# Patient Record
Sex: Female | Born: 1980
Health system: Southern US, Community
[De-identification: ages and names within clinical notes are randomized; demographics above are authoritative.]

## PROBLEM LIST (undated history)

## (undated) HISTORY — PX: APPENDECTOMY: SHX54

---

## 2004-05-13 ENCOUNTER — Observation Stay (HOSPITAL_COMMUNITY): Admission: EM | Admit: 2004-05-13 | Discharge: 2004-05-14 | Payer: Self-pay | Admitting: Emergency Medicine

## 2004-05-21 ENCOUNTER — Other Ambulatory Visit: Admission: RE | Admit: 2004-05-21 | Discharge: 2004-05-21 | Payer: Self-pay | Admitting: Obstetrics and Gynecology

## 2012-12-12 ENCOUNTER — Other Ambulatory Visit: Payer: Self-pay | Admitting: Obstetrics and Gynecology

## 2014-08-01 ENCOUNTER — Other Ambulatory Visit: Payer: Self-pay | Admitting: Obstetrics and Gynecology

## 2014-08-01 DIAGNOSIS — R928 Other abnormal and inconclusive findings on diagnostic imaging of breast: Secondary | ICD-10-CM

## 2014-08-15 ENCOUNTER — Ambulatory Visit
Admission: RE | Admit: 2014-08-15 | Discharge: 2014-08-15 | Disposition: A | Discharge status: Home / Self Care | Payer: BLUE CROSS/BLUE SHIELD | Source: Ambulatory Visit | Attending: Obstetrics and Gynecology | Admitting: Obstetrics and Gynecology

## 2014-08-15 DIAGNOSIS — R928 Other abnormal and inconclusive findings on diagnostic imaging of breast: Secondary | ICD-10-CM

## 2015-07-30 DIAGNOSIS — H8110 Benign paroxysmal vertigo, unspecified ear: Secondary | ICD-10-CM | POA: Insufficient documentation

## 2018-12-20 ENCOUNTER — Other Ambulatory Visit: Payer: Self-pay

## 2018-12-20 ENCOUNTER — Ambulatory Visit: Payer: Commercial Managed Care - PPO | Admitting: Registered Nurse

## 2018-12-20 ENCOUNTER — Encounter: Payer: Self-pay | Admitting: Registered Nurse

## 2018-12-20 VITALS — BP 113/73 | HR 77 | Temp 98.7°F | Resp 16 | Ht 65.95 in | Wt 162.0 lb

## 2018-12-20 DIAGNOSIS — Z5189 Encounter for other specified aftercare: Secondary | ICD-10-CM | POA: Diagnosis not present

## 2018-12-20 DIAGNOSIS — Z7689 Persons encountering health services in other specified circumstances: Secondary | ICD-10-CM

## 2018-12-20 NOTE — Progress Notes (Signed)
New Patient Office Visit  Subjective:  Patient ID: Tiffany Olson, female    DOB: 1980-12-16  Age: 38 y.o. MRN: [id number]  CC:  Chief Complaint  Patient presents with  . Establish Care  . Abscess    pt states she has an abscess on her back ( Lower) need to have checked at this time. Was seen at [facility] around the 10/23 just need to have it rechecked. They did a culture on the area and her results came back as MRSA so they gave her antibotics     HPI Tiffany Olson presents for visit to establish care and wound check.  Notes that two weeks ago, she noticed an area of warmth and swelling on her lower R back. She went to urgent care, where they drained a sizeable abscess, packed it with gauze, and started abx. The wound culture showed MRSA and she was switched to doxycycline for 10 days. She started that course last night. She presents today to have more consistent follow up, establish a PCP and to have her wound checked.    History reviewed. No pertinent past medical history.  Past Surgical History:  Procedure Laterality Date  . APPENDECTOMY      Family History  Problem Relation Age of Onset  . Ovarian cancer Mother   . Lymphoma Father     Social History   Socioeconomic History  . Marital status: Single    Spouse name: Not on file  . Number of children: Not on file  . Years of education: Not on file  . Highest education level: Not on file  Occupational History  . Not on file  Social Needs  . Financial resource strain: Not hard at all  . Food insecurity    Worry: Never true    Inability: Never true  . Transportation needs    Medical: No    Non-medical: No  Tobacco Use  . Smoking status: Never Smoker  . Smokeless tobacco: Never Used  Substance and Sexual Activity  . Alcohol use: Not on file    Comment: 1-2 per week  . Drug use: Never  . Sexual activity: Not Currently  Lifestyle  . Physical activity    Days per week: 3 days    Minutes per session:  30 min  . Stress: Only a little  Relationships  . Social Herbalist on phone: Three times a week    Gets together: Twice a week    Attends religious service: Patient refused    Active member of club or organization: Patient refused    Attends meetings of clubs or organizations: Patient refused    Relationship status: Not on file  . Intimate partner violence    Fear of current or ex partner: No    Emotionally abused: No    Physically abused: No    Forced sexual activity: No  Other Topics Concern  . Not on file  Social History Narrative  . Not on file    ROS Review of Systems  Constitutional: Negative.   HENT: Negative.   Eyes: Negative.   Respiratory: Negative.   Cardiovascular: Negative.   Gastrointestinal: Negative.   Endocrine: Negative.   Genitourinary: Negative.   Musculoskeletal: Negative.   Skin: Positive for wound (per hpi). Negative for color change, pallor and rash.  Allergic/Immunologic: Negative.   Neurological: Negative.   Hematological: Negative.   Psychiatric/Behavioral: Negative.   All other systems reviewed and are negative.   Objective:  Today's Vitals: BP 113/73   Pulse 77   Temp 98.7 F (37.1 C) (Oral)   Resp 16   Ht 5' 5.95" (1.675 m)   Wt 162 lb (73.5 kg)   LMP 12/03/2018 (Approximate)   SpO2 95%   BMI 26.19 kg/m   Physical Exam Vitals signs and nursing note reviewed.  Constitutional:      General: She is not in acute distress.    Appearance: Normal appearance. She is normal weight. She is not ill-appearing, toxic-appearing or diaphoretic.  Cardiovascular:     Rate and Rhythm: Normal rate and regular rhythm.  Pulmonary:     Effort: Pulmonary effort is normal. No respiratory distress.  Skin:    General: Skin is warm and dry.       Neurological:     General: No focal deficit present.     Mental Status: She is alert and oriented to person, place, and time. Mental status is at baseline.  Psychiatric:        Mood and  Affect: Mood normal.        Behavior: Behavior normal.        Thought Content: Thought content normal.        Judgment: Judgment normal.     Assessment & Plan:   Problem List Items Addressed This Visit    None    Visit Diagnoses    Encounter to establish care    -  Primary   Wound check, abscess          Outpatient Encounter Medications as of 12/20/2018  Medication Sig  . desogestrel-ethinyl estradiol (CYCLESSA) 0.1/0.125/0.15 -0.025 MG tablet Take by mouth.  . doxycycline (MONODOX) 100 MG capsule doxycycline monohydrate 100 mg capsule  Take 1 capsule twice a day by oral route for 14 days.   No facility-administered encounter medications on file as of 12/20/2018.     Follow-up: No follow-ups on file.   PLAN  Reviewed history  Pt to return to clinic in 1 week for wound check  Return at her convenience for CPE and labs  Follows with GYN regularly  Patient encouraged to call clinic with any questions, comments, or concerns.   Tiffany Agee, NP

## 2018-12-20 NOTE — Patient Instructions (Signed)
° ° ° °  If you have lab work done today you will be contacted with your lab results within the next 2 weeks.  If you have not heard from us then please contact us. The fastest way to get your results is to register for My Chart. ° ° °IF you received an x-ray today, you will receive an invoice from [facility]. Please contact [facility] at [phone] with questions or concerns regarding your invoice.  ° °IF you received labwork today, you will receive an invoice from [facility]. Please contact [facility] at [phone] with questions or concerns regarding your invoice.  ° °Our billing staff will not be able to assist you with questions regarding bills from these companies. ° °You will be contacted with the lab results as soon as they are available. The fastest way to get your results is to activate your My Chart account. Instructions are located on the last page of this paperwork. If you have not heard from us regarding the results in 2 weeks, please contact this office. °  ° ° ° °

## 2018-12-22 ENCOUNTER — Telehealth: Payer: Self-pay | Admitting: Registered Nurse

## 2018-12-22 NOTE — Telephone Encounter (Signed)
Copied from [place] [phone]. Topic: General - Other >> Dec 22, 2018 10:19 AM Carolyn Stare wrote: Pt call to say the below med is causing her a reaction and is asking it to be changed   doxycycline (MONODOX) 100 MG capsule

## 2018-12-27 ENCOUNTER — Other Ambulatory Visit: Payer: Self-pay

## 2018-12-27 ENCOUNTER — Ambulatory Visit (INDEPENDENT_AMBULATORY_CARE_PROVIDER_SITE_OTHER): Payer: Commercial Managed Care - PPO | Admitting: Registered Nurse

## 2018-12-27 ENCOUNTER — Encounter: Payer: Self-pay | Admitting: Registered Nurse

## 2018-12-27 VITALS — BP 111/71 | HR 74 | Temp 98.7°F | Resp 16 | Wt 161.0 lb

## 2018-12-27 DIAGNOSIS — Z5189 Encounter for other specified aftercare: Secondary | ICD-10-CM | POA: Diagnosis not present

## 2018-12-27 NOTE — Patient Instructions (Signed)
° ° ° °  If you have lab work done today you will be contacted with your lab results within the next 2 weeks.  If you have not heard from us then please contact us. The fastest way to get your results is to register for My Chart. ° ° °IF you received an x-ray today, you will receive an invoice from [facility]. Please contact [facility] at [phone] with questions or concerns regarding your invoice.  ° °IF you received labwork today, you will receive an invoice from [facility]. Please contact [facility] at [phone] with questions or concerns regarding your invoice.  ° °Our billing staff will not be able to assist you with questions regarding bills from these companies. ° °You will be contacted with the lab results as soon as they are available. The fastest way to get your results is to activate your My Chart account. Instructions are located on the last page of this paperwork. If you have not heard from us regarding the results in 2 weeks, please contact this office. °  ° ° ° °

## 2018-12-27 NOTE — Progress Notes (Signed)
 Established Patient Office Visit  Subjective:  Patient ID: Tiffany Olson, female    DOB: 03/08/1980  Age: 38 y.o. MRN: [id number]  CC:  Chief Complaint  Patient presents with  . Wound Check  . Medication Reaction    pt states she noticed a rash all over her body after starting the mediction (Doxycycline)      HPI Tiffany Olson presents for wound check follow up  As detailed, had abscess I&D from urgent care - presented last week for wound check. It was noted to still have some drainage and was tender and firm. Urgent care had started a wound culture, and they had called to have her start on 100mg doxycycline PO bid for 10 days. She states that last Thursday, she noted a diffuse, itchy, erythematous rash that resolved by Saturday. She states it did not seem like hives, the itching was very minor. Denies swelling of lips, tongue, throat, as well as denying fever, fatigue, shob, chills, headaches, or other symptoms of allergic reaction or sepsis. Feels she is moving in a good direction as the wound is no longer painful and is draining only scant amounts of purulent drainage.  History reviewed. No pertinent past medical history.  Past Surgical History:  Procedure Laterality Date  . APPENDECTOMY      Family History  Problem Relation Age of Onset  . Ovarian cancer Mother   . Lymphoma Father     Social History   Socioeconomic History  . Marital status: Single    Spouse name: Not on file  . Number of children: Not on file  . Years of education: Not on file  . Highest education level: Not on file  Occupational History  . Not on file  Social Needs  . Financial resource strain: Not hard at all  . Food insecurity    Worry: Never true    Inability: Never true  . Transportation needs    Medical: No    Non-medical: No  Tobacco Use  . Smoking status: Never Smoker  . Smokeless tobacco: Never Used  Substance and Sexual Activity  . Alcohol use: Not on file   Comment: 1-2 per week  . Drug use: Never  . Sexual activity: Not Currently  Lifestyle  . Physical activity    Days per week: 3 days    Minutes per session: 30 min  . Stress: Only a little  Relationships  . Social connections    Talks on phone: Three times a week    Gets together: Twice a week    Attends religious service: Patient refused    Active member of club or organization: Patient refused    Attends meetings of clubs or organizations: Patient refused    Relationship status: Not on file  . Intimate partner violence    Fear of current or ex partner: No    Emotionally abused: No    Physically abused: No    Forced sexual activity: No  Other Topics Concern  . Not on file  Social History Narrative  . Not on file    Outpatient Medications Prior to Visit  Medication Sig Dispense Refill  . desogestrel-ethinyl estradiol (CYCLESSA) 0.1/0.125/0.15 -0.025 MG tablet Take by mouth.    . doxycycline (MONODOX) 100 MG capsule doxycycline monohydrate 100 mg capsule  Take 1 capsule twice a day by oral route for 14 days.     No facility-administered medications prior to visit.     Allergies  Allergen Reactions  .   Oxycodone-Acetaminophen Nausea Only    ROS Review of Systems Per hpi   Objective:    Physical Exam  Constitutional: She is oriented to person, place, and time. She appears well-developed and well-nourished. No distress.  Cardiovascular: Normal rate and regular rhythm.  Pulmonary/Chest: Effort normal. No respiratory distress.  Neurological: She is alert and oriented to person, place, and time.  Skin: Skin is warm and dry. No rash noted. She is not diaphoretic. No erythema. No pallor.     Psychiatric: She has a normal mood and affect. Her behavior is normal. Judgment and thought content normal.  Nursing note and vitals reviewed.   BP 111/71   Pulse 74   Temp 98.7 F (37.1 C) (Oral)   Resp 16   Wt 161 lb (73 kg)   LMP 12/03/2018 (Approximate)   SpO2 100%    BMI 26.03 kg/m  Wt Readings from Last 3 Encounters:  12/27/18 161 lb (73 kg)  12/20/18 162 lb (73.5 kg)     Health Maintenance Due  Topic Date Due  . HIV Screening  05/08/1993  . PAP SMEAR-Modifier  05/09/1999    There are no preventive care reminders to display for this patient.  No results found for: TSH No results found for: WBC, HGB, HCT, MCV, PLT No results found for: NA, K, CHLORIDE, CO2, GLUCOSE, BUN, CREATININE, BILITOT, ALKPHOS, AST, ALT, PROT, ALBUMIN, CALCIUM, ANIONGAP, EGFR, GFR No results found for: CHOL No results found for: HDL No results found for: LDLCALC No results found for: TRIG No results found for: CHOLHDL No results found for: HGBA1C    Assessment & Plan:   Problem List Items Addressed This Visit    None    Visit Diagnoses    Wound check, abscess    -  Primary      No orders of the defined types were placed in this encounter.   Follow-up: No follow-ups on file.   PLAN  Her doxycycline rash does not sound allergic in nature - given its relatively minor status and lack of further symptoms after resolution, we will have her continue the doxycycline for the remainder of 10 day course. Reviewed ED precautions and early symptoms of anaphylaxis and SJS.  Wound looks to be healing well. Removed about 1-1.5" of packing today. Encouraged her to remove short portions every 3-4 days as tolerated. Should she have any further concerns regarding the packing, she may return to clinic. We discussed that a slow, inside out healing is desired to try to prevent recurrence. Pt demonstrated understanding  Patient encouraged to call clinic with any questions, comments, or concerns.   Richard Morrow, NP 

## 2019-02-07 ENCOUNTER — Other Ambulatory Visit: Payer: Self-pay | Admitting: Obstetrics and Gynecology

## 2019-02-07 DIAGNOSIS — R928 Other abnormal and inconclusive findings on diagnostic imaging of breast: Secondary | ICD-10-CM

## 2019-02-12 ENCOUNTER — Ambulatory Visit
Admission: RE | Admit: 2019-02-12 | Discharge: 2019-02-12 | Disposition: A | Discharge status: Home / Self Care | Payer: Commercial Managed Care - PPO | Source: Ambulatory Visit | Attending: Obstetrics and Gynecology | Admitting: Obstetrics and Gynecology

## 2019-02-12 ENCOUNTER — Ambulatory Visit
Admission: RE | Admit: 2019-02-12 | Discharge: 2019-02-12 | Disposition: A | Discharge status: Home / Self Care | Payer: BLUE CROSS/BLUE SHIELD | Source: Ambulatory Visit | Attending: Obstetrics and Gynecology | Admitting: Obstetrics and Gynecology

## 2019-02-12 ENCOUNTER — Other Ambulatory Visit: Payer: Self-pay | Admitting: Obstetrics and Gynecology

## 2019-02-12 ENCOUNTER — Other Ambulatory Visit: Payer: Self-pay

## 2019-02-12 DIAGNOSIS — N6310 Unspecified lump in the right breast, unspecified quadrant: Secondary | ICD-10-CM

## 2019-02-12 DIAGNOSIS — R928 Other abnormal and inconclusive findings on diagnostic imaging of breast: Secondary | ICD-10-CM

## 2019-02-21 ENCOUNTER — Ambulatory Visit
Admission: RE | Admit: 2019-02-21 | Discharge: 2019-02-21 | Disposition: A | Discharge status: Home / Self Care | Payer: 59 | Source: Ambulatory Visit | Attending: Obstetrics and Gynecology | Admitting: Obstetrics and Gynecology

## 2019-02-21 ENCOUNTER — Other Ambulatory Visit: Payer: Self-pay

## 2019-02-21 DIAGNOSIS — N6310 Unspecified lump in the right breast, unspecified quadrant: Secondary | ICD-10-CM

## 2019-11-12 ENCOUNTER — Ambulatory Visit (INDEPENDENT_AMBULATORY_CARE_PROVIDER_SITE_OTHER): Payer: 59 | Admitting: Registered Nurse

## 2019-11-12 ENCOUNTER — Encounter: Payer: Self-pay | Admitting: Registered Nurse

## 2019-11-12 ENCOUNTER — Other Ambulatory Visit: Payer: Self-pay

## 2019-11-12 VITALS — BP 120/74 | HR 95 | Temp 97.8°F | Resp 18 | Ht 65.0 in | Wt 160.8 lb

## 2019-11-12 DIAGNOSIS — N644 Mastodynia: Secondary | ICD-10-CM | POA: Diagnosis not present

## 2019-11-12 DIAGNOSIS — N61 Mastitis without abscess: Secondary | ICD-10-CM | POA: Diagnosis not present

## 2019-11-12 MED ORDER — DICLOXACILLIN SODIUM 500 MG PO CAPS: 500.0000 mg | ORAL_CAPSULE | Freq: Four times a day (QID) | ORAL | 0 refills | Status: DC

## 2019-11-12 MED ORDER — TRAMADOL HCL 50 MG PO TABS: 50.0000 mg | ORAL_TABLET | Freq: Three times a day (TID) | ORAL | 0 refills | Status: AC | PRN

## 2019-11-12 MED ORDER — DICLOFENAC SODIUM 75 MG PO TBEC: 75.0000 mg | DELAYED_RELEASE_TABLET | Freq: Two times a day (BID) | ORAL | 0 refills | Status: DC

## 2019-11-12 NOTE — Progress Notes (Signed)
Acute Office Visit  Subjective:    Patient ID: Tiffany Olson, female    DOB: 05/19/1980, 39 y.o.   MRN: [id number]  Chief Complaint  Patient presents with  . Cyst    patient states she has noticed an bump on her on her breast and really red and painful thats bigger than a quarter.    HPI Patient is in today for L breast pain  Noted around 10 days ago there was a small bump Acutely got bigger, warmer, and very tender No drainage or clear "head" to lesion fam hx of cancer - has been screened recently and had biopsy in Jan which was benign No axillary pain  No nipple discharge No new veins  No other concerns at this time  No past medical history on file.  Past Surgical History:  Procedure Laterality Date  . APPENDECTOMY      Family History  Problem Relation Age of Onset  . Ovarian cancer Mother   . Lymphoma Father     Social History   Socioeconomic History  . Marital status: Single    Spouse name: Not on file  . Number of children: Not on file  . Years of education: Not on file  . Highest education level: Not on file  Occupational History  . Not on file  Tobacco Use  . Smoking status: Never Smoker  . Smokeless tobacco: Never Used  Vaping Use  . Vaping Use: Never used  Substance and Sexual Activity  . Alcohol use: Not on file    Comment: 1-2 per week  . Drug use: Never  . Sexual activity: Not Currently  Other Topics Concern  . Not on file  Social History Narrative  . Not on file   Social Determinants of Health   Financial Resource Strain: Low Risk   . Difficulty of Paying Living Expenses: Not hard at all  Food Insecurity: No Food Insecurity  . Worried About Charity fundraiser in the Last Year: Never true  . Ran Out of Food in the Last Year: Never true  Transportation Needs: No Transportation Needs  . Lack of Transportation (Medical): No  . Lack of Transportation (Non-Medical): No  Physical Activity: Insufficiently Active  . Days of  Exercise per Week: 3 days  . Minutes of Exercise per Session: 30 min  Stress: No Stress Concern Present  . Feeling of Stress : Only a little  Social Connections: Unknown  . Frequency of Communication with Friends and Family: Three times a week  . Frequency of Social Gatherings with Friends and Family: Twice a week  . Attends Religious Services: Patient refused  . Active Member of Clubs or Organizations: Patient refused  . Attends Archivist Meetings: Patient refused  . Marital Status: Not on file  Intimate Partner Violence: Not At Risk  . Fear of Current or Ex-Partner: No  . Emotionally Abused: No  . Physically Abused: No  . Sexually Abused: No    Outpatient Medications Prior to Visit  Medication Sig Dispense Refill  . desogestrel-ethinyl estradiol (CYCLESSA) 0.1/0.125/0.15 -0.025 MG tablet Take by mouth.    . doxycycline (MONODOX) 100 MG capsule doxycycline monohydrate 100 mg capsule  Take 1 capsule twice a day by oral route for 14 days. (Patient not taking: Reported on 11/12/2019)     No facility-administered medications prior to visit.    Allergies  Allergen Reactions  . Oxycodone-Acetaminophen Nausea Only    Review of Systems Per hpi  Objective:    Physical Exam Vitals and nursing note reviewed.  Constitutional:      General: She is not in acute distress.    Appearance: Normal appearance. She is normal weight. She is not ill-appearing, toxic-appearing or diaphoretic.  Cardiovascular:     Rate and Rhythm: Normal rate and regular rhythm.  Pulmonary:     Effort: Pulmonary effort is normal. No respiratory distress.  Chest:    Skin:    General: Skin is warm and dry.     Coloration: Skin is not jaundiced or pale.     Findings: Erythema present. No bruising, lesion or rash.  Neurological:     Mental Status: She is alert.     BP 120/74   Pulse 95   Temp 97.8 F (36.6 C) (Temporal)   Resp 18   Ht 5' 5"  (1.651 m)   Wt 160 lb 12.8 oz (72.9 kg)    SpO2 98%   BMI 26.76 kg/m  Wt Readings from Last 3 Encounters:  11/12/19 160 lb 12.8 oz (72.9 kg)  12/27/18 161 lb (73 kg)  12/20/18 162 lb (73.5 kg)    There are no preventive care reminders to display for this patient.  There are no preventive care reminders to display for this patient.   No results found for: TSH No results found for: WBC, HGB, HCT, MCV, PLT No results found for: NA, K, CHLORIDE, CO2, GLUCOSE, BUN, CREATININE, BILITOT, ALKPHOS, AST, ALT, PROT, ALBUMIN, CALCIUM, ANIONGAP, EGFR, GFR No results found for: CHOL No results found for: HDL No results found for: LDLCALC No results found for: TRIG No results found for: CHOLHDL No results found for: HGBA1C     Assessment & Plan:   Problem List Items Addressed This Visit    None    Visit Diagnoses    Infection of breast    -  Primary   Relevant Medications   dicloxacillin (DYNAPEN) 500 MG capsule   Breast pain, right       Relevant Medications   diclofenac (VOLTAREN) 75 MG EC tablet   traMADol (ULTRAM) 50 MG tablet       Meds ordered this encounter  Medications  . dicloxacillin (DYNAPEN) 500 MG capsule    Sig: Take 1 capsule (500 mg total) by mouth 4 (four) times daily.    Dispense:  20 capsule    Refill:  0    Order Specific Question:   Supervising Provider    Answer:   Carlota Raspberry, JEFFREY R [[id number]]  . diclofenac (VOLTAREN) 75 MG EC tablet    Sig: Take 1 tablet (75 mg total) by mouth 2 (two) times daily.    Dispense:  14 tablet    Refill:  0    Order Specific Question:   Supervising Provider    Answer:   Carlota Raspberry, JEFFREY R [[id number]]  . traMADol (ULTRAM) 50 MG tablet    Sig: Take 1 tablet (50 mg total) by mouth every 8 (eight) hours as needed for up to 5 days.    Dispense:  7 tablet    Refill:  0    Order Specific Question:   Supervising Provider    Answer:   Carlota Raspberry, JEFFREY R [[id number]]   PLAN  Appears as infection rather than acute malignancy. Dynapen 526m PO qid for 5 days  Diclofenac and tramadol  for pain - supplement with tylenol as needed  Rest, hydration, heat/ice.  If no improvement within 3-4 days will get mammography to rule out malignancy  Patient encouraged to call clinic with any questions, comments, or concerns.  Maximiano Coss, NP

## 2019-11-12 NOTE — Patient Instructions (Signed)
° ° ° °  If you have lab work done today you will be contacted with your lab results within the next 2 weeks.  If you have not heard from us then please contact us. The fastest way to get your results is to register for My Chart. ° ° °IF you received an x-ray today, you will receive an invoice from [facility]. Please contact [facility] at [phone] with questions or concerns regarding your invoice.  ° °IF you received labwork today, you will receive an invoice from [facility]. Please contact [facility] at [phone] with questions or concerns regarding your invoice.  ° °Our billing staff will not be able to assist you with questions regarding bills from these companies. ° °You will be contacted with the lab results as soon as they are available. The fastest way to get your results is to activate your My Chart account. Instructions are located on the last page of this paperwork. If you have not heard from us regarding the results in 2 weeks, please contact this office. °  ° ° ° °

## 2019-11-20 ENCOUNTER — Other Ambulatory Visit: Payer: Self-pay | Admitting: Registered Nurse

## 2019-11-20 DIAGNOSIS — N61 Mastitis without abscess: Secondary | ICD-10-CM

## 2019-11-20 NOTE — Telephone Encounter (Signed)
Requested medication (s) are due for refill today: no  Requested medication (s) are on the active medication list: yes   Last refill:  11/12/19 #20 0 refills   Future visit scheduled: yes in 2 months   Notes to clinic:  no new refills ordered. Patient requesting another refill, do you want to renew?     Requested Prescriptions  Pending Prescriptions Disp Refills   dicloxacillin (DYNAPEN) 500 MG capsule 20 capsule 0    Sig: Take 1 capsule (500 mg total) by mouth 4 (four) times daily.      Off-Protocol Failed - 11/20/2019  5:34 PM      Failed - Medication not assigned to a protocol, review manually.      Passed - Valid encounter within last 12 months    Recent Outpatient Visits           1 week ago Infection of breast   [facility], Gerlene Burdock, NP   10 months ago Wound check, abscess   [facility], Gerlene Burdock, NP   11 months ago Encounter to establish care   [facility], Gerlene Burdock, NP       Future Appointments             In 2 months Janeece Agee, NP [facility] at [place], [facility]

## 2019-11-20 NOTE — Telephone Encounter (Signed)
dicloxacillin (DYNAPEN) 500 MG capsule Medication Date: 11/12/2019 Department: [facility] at [place] Ordering/Authorizing: Janeece Agee, NP   This pt wants a refill as this has been working very well but infection is almost gone, pain almost gone but not quite all the way. Wants refill as feels def working. [id number] [place], [place] - [address] AT [facility] [address] Phone:  [phone]  Fax:  [phone]    New  Pharmacy

## 2019-11-21 MED ORDER — DICLOXACILLIN SODIUM 500 MG PO CAPS: 500.0000 mg | ORAL_CAPSULE | Freq: Four times a day (QID) | ORAL | 0 refills | Status: DC

## 2019-11-21 NOTE — Telephone Encounter (Signed)
Pt is returning the call from message below pt would like a call back when available. Please advise.

## 2019-11-21 NOTE — Telephone Encounter (Signed)
LVM for patient to return call regarding dynapen rx request to get more information regarding infection. Refer to 09/27 OV note.

## 2019-11-21 NOTE — Telephone Encounter (Signed)
Patient is requesting a refill for antibiotics due to area still being red and experiencing some itching , regular lotion not really helping . No longer having pain , swelling has gone down . Please advise

## 2020-01-22 ENCOUNTER — Ambulatory Visit (INDEPENDENT_AMBULATORY_CARE_PROVIDER_SITE_OTHER): Payer: 59 | Admitting: Registered Nurse

## 2020-01-22 ENCOUNTER — Other Ambulatory Visit: Payer: Self-pay

## 2020-01-22 ENCOUNTER — Encounter: Payer: Self-pay | Admitting: Registered Nurse

## 2020-01-22 VITALS — BP 116/75 | HR 77 | Temp 98.0°F | Ht 65.0 in | Wt 163.0 lb

## 2020-01-22 DIAGNOSIS — M25562 Pain in left knee: Secondary | ICD-10-CM

## 2020-01-22 DIAGNOSIS — Z1329 Encounter for screening for other suspected endocrine disorder: Secondary | ICD-10-CM

## 2020-01-22 DIAGNOSIS — Z0000 Encounter for general adult medical examination without abnormal findings: Secondary | ICD-10-CM

## 2020-01-22 DIAGNOSIS — Z0001 Encounter for general adult medical examination with abnormal findings: Secondary | ICD-10-CM

## 2020-01-22 DIAGNOSIS — G8929 Other chronic pain: Secondary | ICD-10-CM

## 2020-01-22 DIAGNOSIS — Z130 Encounter for screening for diseases of the blood and blood-forming organs and certain disorders involving the immune mechanism: Secondary | ICD-10-CM

## 2020-01-22 DIAGNOSIS — Z13228 Encounter for screening for other metabolic disorders: Secondary | ICD-10-CM

## 2020-01-22 DIAGNOSIS — L570 Actinic keratosis: Secondary | ICD-10-CM

## 2020-01-22 DIAGNOSIS — Z13220 Encounter for screening for lipoid disorders: Secondary | ICD-10-CM

## 2020-01-22 LAB — COMPREHENSIVE METABOLIC PANEL
ALT: 13 IU/L (ref 0–32)
Alkaline Phosphatase: 72 IU/L (ref 44–121)

## 2020-01-22 LAB — CBC WITH DIFFERENTIAL
EOS (ABSOLUTE): 0.2 10*3/uL (ref 0.0–0.4)
Eos: 3 %
Immature Granulocytes: 0 %
MCHC: 34.1 g/dL (ref 31.5–35.7)
Monocytes Absolute: 0.5 10*3/uL (ref 0.1–0.9)
RDW: 11.9 % (ref 11.7–15.4)

## 2020-01-22 LAB — HEMOGLOBIN A1C: Hgb A1c MFr Bld: 4.9 % (ref 4.8–5.6)

## 2020-01-22 NOTE — Progress Notes (Signed)
Established Patient Office Visit  Subjective:  Patient ID: Tiffany Olson, female    DOB: 1980/07/28  Age: 39 y.o. MRN: [id number]  CC:  Chief Complaint  Patient presents with  . Annual Exam    yearly physical / no pap sees gyn Dr Lowe/ mammo 07/2019    HPI Tiffany Olson presents for CPE and labs  Has followed with women's health for pap and mammogram No hx abnormal mammogram Hx of abnormal pap - every few years - follow up has always been normal Last pap and mammo in June 2021 - both wnl  Does not get routine bloodwork through gyn, interested in getting this today.  Skin lesion: small, firm raised lesions on forehead and cheek L side of face. Have been stable since she noticed 1+ year ago. No ABCDE changes that warrant concern. No new lesions. Has had hx of much sun exposure.  Knee soreness: L knee, usually aches after 5+ miles of hiking. Overall manageable. Taking glucosamine-chondroitin otc with good effect. Does not feel she needs otc analgesics at this time. Wants to delay further intervention as long as possible.  Otherwise feeling well. Histories updated as warranted.  No past medical history on file.  Past Surgical History:  Procedure Laterality Date  . APPENDECTOMY      Family History  Problem Relation Age of Onset  . Ovarian cancer Mother   . Lymphoma Father     Social History   Socioeconomic History  . Marital status: Single    Spouse name: Not on file  . Number of children: Not on file  . Years of education: Not on file  . Highest education level: Not on file  Occupational History  . Not on file  Tobacco Use  . Smoking status: Never Smoker  . Smokeless tobacco: Never Used  Vaping Use  . Vaping Use: Never used  Substance and Sexual Activity  . Alcohol use: Not on file    Comment: 1-2 per week  . Drug use: Never  . Sexual activity: Not Currently  Other Topics Concern  . Not on file  Social History Narrative  . Not on file    Social Determinants of Health   Financial Resource Strain:   . Difficulty of Paying Living Expenses: Not on file  Food Insecurity:   . Worried About Charity fundraiser in the Last Year: Not on file  . Ran Out of Food in the Last Year: Not on file  Transportation Needs:   . Lack of Transportation (Medical): Not on file  . Lack of Transportation (Non-Medical): Not on file  Physical Activity:   . Days of Exercise per Week: Not on file  . Minutes of Exercise per Session: Not on file  Stress:   . Feeling of Stress : Not on file  Social Connections:   . Frequency of Communication with Friends and Family: Not on file  . Frequency of Social Gatherings with Friends and Family: Not on file  . Attends Religious Services: Not on file  . Active Member of Clubs or Organizations: Not on file  . Attends Archivist Meetings: Not on file  . Marital Status: Not on file  Intimate Partner Violence:   . Fear of Current or Ex-Partner: Not on file  . Emotionally Abused: Not on file  . Physically Abused: Not on file  . Sexually Abused: Not on file    Outpatient Medications Prior to Visit  Medication Sig Dispense Refill  .  desogestrel-ethinyl estradiol (CYCLESSA) 0.1/0.125/0.15 -0.025 MG tablet Take by mouth.    . diclofenac (VOLTAREN) 75 MG EC tablet Take 1 tablet (75 mg total) by mouth 2 (two) times daily. 14 tablet 0  . dicloxacillin (DYNAPEN) 500 MG capsule Take 1 capsule (500 mg total) by mouth 4 (four) times daily. 20 capsule 0  . doxycycline (MONODOX) 100 MG capsule doxycycline monohydrate 100 mg capsule  Take 1 capsule twice a day by oral route for 14 days. (Patient not taking: Reported on 11/12/2019)     No facility-administered medications prior to visit.    Allergies  Allergen Reactions  . Oxycodone-Acetaminophen Nausea Only    ROS Review of Systems  Constitutional: Negative.   HENT: Negative.   Eyes: Negative.   Respiratory: Negative.   Cardiovascular: Negative.    Gastrointestinal: Negative.   Genitourinary: Negative.   Musculoskeletal: Negative.   Skin: Negative.   Neurological: Negative.   Psychiatric/Behavioral: Negative.       Objective:    Physical Exam Vitals and nursing note reviewed.  Constitutional:      General: She is not in acute distress.    Appearance: Normal appearance. She is normal weight. She is not ill-appearing, toxic-appearing or diaphoretic.  HENT:     Head: Normocephalic and atraumatic.     Right Ear: Tympanic membrane, ear canal and external ear normal. There is no impacted cerumen.     Left Ear: Tympanic membrane, ear canal and external ear normal. There is no impacted cerumen.     Nose: Nose normal. No congestion or rhinorrhea.     Mouth/Throat:     Mouth: Mucous membranes are moist.     Pharynx: Oropharynx is clear. No oropharyngeal exudate or posterior oropharyngeal erythema.  Eyes:     General: No scleral icterus.       Right eye: No discharge.        Left eye: No discharge.     Extraocular Movements: Extraocular movements intact.     Conjunctiva/sclera: Conjunctivae normal.     Pupils: Pupils are equal, round, and reactive to light.  Cardiovascular:     Rate and Rhythm: Normal rate and regular rhythm.     Pulses: Normal pulses.     Heart sounds: Normal heart sounds. No murmur heard.  No friction rub. No gallop.   Pulmonary:     Effort: Pulmonary effort is normal. No respiratory distress.     Breath sounds: Normal breath sounds. No stridor. No wheezing, rhonchi or rales.  Chest:     Chest wall: No tenderness.  Abdominal:     General: Abdomen is flat. Bowel sounds are normal. There is no distension.     Palpations: Abdomen is soft. There is no mass.     Tenderness: There is no abdominal tenderness. There is no right CVA tenderness, left CVA tenderness, guarding or rebound.     Hernia: No hernia is present.  Musculoskeletal:        General: No swelling, tenderness, deformity or signs of injury. Normal  range of motion.     Right lower leg: No edema.     Left lower leg: No edema.  Skin:    General: Skin is warm and dry.     Capillary Refill: Capillary refill takes less than 2 seconds.     Coloration: Skin is not jaundiced or pale.     Findings: Lesion (likely keratotic skin lesions on L side of face.) present. No bruising, erythema or rash.  Neurological:  General: No focal deficit present.     Mental Status: She is alert and oriented to person, place, and time. Mental status is at baseline.     Cranial Nerves: No cranial nerve deficit.     Sensory: No sensory deficit.     Motor: No weakness.     Coordination: Coordination normal.     Gait: Gait normal.     Deep Tendon Reflexes: Reflexes normal.  Psychiatric:        Mood and Affect: Mood normal.        Behavior: Behavior normal.        Thought Content: Thought content normal.        Judgment: Judgment normal.     BP 116/75   Pulse 77   Temp 98 F (36.7 C)   Ht 5' 5" (1.651 m)   Wt 163 lb (73.9 kg)   LMP 01/14/2020   SpO2 98%   BMI 27.12 kg/m  Wt Readings from Last 3 Encounters:  01/22/20 163 lb (73.9 kg)  11/12/19 160 lb 12.8 oz (72.9 kg)  12/27/18 161 lb (73 kg)     There are no preventive care reminders to display for this patient.  There are no preventive care reminders to display for this patient.  No results found for: TSH No results found for: WBC, HGB, HCT, MCV, PLT No results found for: NA, K, CHLORIDE, CO2, GLUCOSE, BUN, CREATININE, BILITOT, ALKPHOS, AST, ALT, PROT, ALBUMIN, CALCIUM, ANIONGAP, EGFR, GFR No results found for: CHOL No results found for: HDL No results found for: LDLCALC No results found for: TRIG No results found for: CHOLHDL No results found for: HGBA1C    Assessment & Plan:   Problem List Items Addressed This Visit      Other   Chronic pain of left knee   Keratotic lesion   Relevant Orders   Ambulatory referral to Dermatology    Other Visit Diagnoses    Routine general  medical examination at a health care facility    -  Primary   Screening for endocrine, metabolic and immunity disorder       Relevant Orders   CBC With Differential   Comprehensive metabolic panel   Hemoglobin A1c   TSH   Lipid screening       Relevant Orders   Lipid panel      No orders of the defined types were placed in this encounter.   Follow-up: Return in 1 year (on 01/21/2021).   PLAN  Continue to monitor knee. PT viable next step. Ok to use otc diclofenac gel for pain relief if needed  While skin lesions appear benign, will refer to derm given fair complexion and hx of sun exposure. Would benefit from full skin survey  Labs collected. Will follow up with the patient as warranted.  Exam unremarkable  Return in 1 year for CPE and labs  Patient encouraged to call clinic with any questions, comments, or concerns.  Maximiano Coss, NP

## 2020-01-22 NOTE — Patient Instructions (Addendum)
   If you have lab work done today you will be contacted with your lab results within the next 2 weeks.  If you have not heard from us then please contact us. The fastest way to get your results is to register for My Chart.   IF you received an x-ray today, you will receive an invoice from [facility]. Please contact [facility] at [phone] with questions or concerns regarding your invoice.   IF you received labwork today, you will receive an invoice from [facility]. Please contact [facility] at [phone] with questions or concerns regarding your invoice.   Our billing staff will not be able to assist you with questions regarding bills from these companies.  You will be contacted with the lab results as soon as they are available. The fastest way to get your results is to activate your My Chart account. Instructions are located on the last page of this paperwork. If you have not heard from us regarding the results in 2 weeks, please contact this office.       Health Maintenance, Female Adopting a healthy lifestyle and getting preventive care are important in promoting health and wellness. Ask your health care provider about:  The right schedule for you to have regular tests and exams.  Things you can do on your own to prevent diseases and keep yourself healthy. What should I know about diet, weight, and exercise? Eat a healthy diet   Eat a diet that includes plenty of vegetables, fruits, low-fat dairy products, and lean protein.  Do not eat a lot of foods that are high in solid fats, added sugars, or sodium. Maintain a healthy weight Body mass index (BMI) is used to identify weight problems. It estimates body fat based on height and weight. Your health care provider can help determine your BMI and help you achieve or maintain a healthy weight. Get regular exercise Get regular exercise. This is one of the most important things you can do for your health. Most  adults should:  Exercise for at least 150 minutes each week. The exercise should increase your heart rate and make you sweat (moderate-intensity exercise).  Do strengthening exercises at least twice a week. This is in addition to the moderate-intensity exercise.  Spend less time sitting. Even light physical activity can be beneficial. Watch cholesterol and blood lipids Have your blood tested for lipids and cholesterol at 41 years of age, then have this test every 5 years. Have your cholesterol levels checked more often if:  Your lipid or cholesterol levels are high.  You are older than 40 years of age.  You are at high risk for heart disease. What should I know about cancer screening? Depending on your health history and family history, you may need to have cancer screening at various ages. This may include screening for:  Breast cancer.  Cervical cancer.  Colorectal cancer.  Skin cancer.  Lung cancer. What should I know about heart disease, diabetes, and high blood pressure? Blood pressure and heart disease  High blood pressure causes heart disease and increases the risk of stroke. This is more likely to develop in people who have high blood pressure readings, are of African descent, or are overweight.  Have your blood pressure checked: ? Every 3-5 years if you are 18-39 years of age. ? Every year if you are 40 years old or older. Diabetes Have regular diabetes screenings. This checks your fasting blood sugar level. Have the screening done:  Once   every three years after age 40 if you are at a normal weight and have a low risk for diabetes.  More often and at a younger age if you are overweight or have a high risk for diabetes. What should I know about preventing infection? Hepatitis B If you have a higher risk for hepatitis B, you should be screened for this virus. Talk with your health care provider to find out if you are at risk for hepatitis B infection. Hepatitis  C Testing is recommended for:  Everyone born from 1945 through 1965.  Anyone with known risk factors for hepatitis C. Sexually transmitted infections (STIs)  Get screened for STIs, including gonorrhea and chlamydia, if: ? You are sexually active and are younger than 41 years of age. ? You are older than 41 years of age and your health care provider tells you that you are at risk for this type of infection. ? Your sexual activity has changed since you were last screened, and you are at increased risk for chlamydia or gonorrhea. Ask your health care provider if you are at risk.  Ask your health care provider about whether you are at high risk for HIV. Your health care provider may recommend a prescription medicine to help prevent HIV infection. If you choose to take medicine to prevent HIV, you should first get tested for HIV. You should then be tested every 3 months for as long as you are taking the medicine. Pregnancy  If you are about to stop having your period (premenopausal) and you may become pregnant, seek counseling before you get pregnant.  Take 400 to 800 micrograms (mcg) of folic acid every day if you become pregnant.  Ask for birth control (contraception) if you want to prevent pregnancy. Osteoporosis and menopause Osteoporosis is a disease in which the bones lose minerals and strength with aging. This can result in bone fractures. If you are 65 years old or older, or if you are at risk for osteoporosis and fractures, ask your health care provider if you should:  Be screened for bone loss.  Take a calcium or vitamin D supplement to lower your risk of fractures.  Be given hormone replacement therapy (HRT) to treat symptoms of menopause. Follow these instructions at home: Lifestyle  Do not use any products that contain nicotine or tobacco, such as cigarettes, e-cigarettes, and chewing tobacco. If you need help quitting, ask your health care provider.  Do not use street  drugs.  Do not share needles.  Ask your health care provider for help if you need support or information about quitting drugs. Alcohol use  Do not drink alcohol if: ? Your health care provider tells you not to drink. ? You are pregnant, may be pregnant, or are planning to become pregnant.  If you drink alcohol: ? Limit how much you use to 0-1 drink a day. ? Limit intake if you are breastfeeding.  Be aware of how much alcohol is in your drink. In the [place] one drink equals one 12 oz bottle of beer (355 mL), one 5 oz glass of wine (148 mL), or one 1 oz glass of hard liquor (44 mL). General instructions  Schedule regular health, dental, and eye exams.  Stay current with your vaccines.  Tell your health care provider if: ? You often feel depressed. ? You have ever been abused or do not feel safe at home. Summary  Adopting a healthy lifestyle and getting preventive care are important in promoting health and   wellness.  Follow your health care provider's instructions about healthy diet, exercising, and getting tested or screened for diseases.  Follow your health care provider's instructions on monitoring your cholesterol and blood pressure. This information is not intended to replace advice given to you by your health care provider. Make sure you discuss any questions you have with your health care provider. Document Revised: 01/25/2018 Document Reviewed: 01/25/2018 Elsevier Patient Education  2020 [employer]vier Inc.     Why follow it? Research shows. . Those who follow the Mediterranean diet have a reduced risk of heart disease  . The diet is associated with a reduced incidence of Parkinson's and Alzheimer's diseases . People following the diet may have longer life expectancies and lower rates of chronic diseases  . The Dietary Guidelines for Americans recommends the Mediterranean diet as an eating plan to promote health and prevent disease  What Is the Mediterranean Diet?   . Healthy eating plan based on typical foods and recipes of Mediterranean-style cooking . The diet is primarily a plant based diet; these foods should make up a majority of meals   Starches - Plant based foods should make up a majority of meals - They are an important sources of vitamins, minerals, energy, antioxidants, and fiber - Choose whole grains, foods high in fiber and minimally processed items  - Typical grain sources include wheat, oats, barley, corn, brown rice, bulgar, farro, millet, polenta, couscous  - Various types of beans include chickpeas, lentils, fava beans, black beans, white beans   Fruits  Veggies - Large quantities of antioxidant rich fruits & veggies; 6 or more servings  - Vegetables can be eaten raw or lightly drizzled with oil and cooked  - Vegetables common to the traditional Mediterranean Diet include: artichokes, arugula, beets, broccoli, brussel sprouts, cabbage, carrots, celery, collard greens, cucumbers, eggplant, kale, leeks, lemons, lettuce, mushrooms, okra, onions, peas, peppers, potatoes, pumpkin, radishes, rutabaga, shallots, spinach, sweet potatoes, turnips, zucchini - Fruits common to the Mediterranean Diet include: apples, apricots, avocados, cherries, clementines, dates, figs, grapefruits, grapes, melons, nectarines, oranges, peaches, pears, pomegranates, strawberries, tangerines  Fats - Replace butter and margarine with healthy oils, such as olive oil, canola oil, and tahini  - Limit nuts to no more than a handful a day  - Nuts include walnuts, almonds, pecans, pistachios, pine nuts  - Limit or avoid candied, honey roasted or heavily salted nuts - Olives are central to the Mediterranean diet - can be eaten whole or used in a variety of dishes   Meats Protein - Limiting red meat: no more than a few times a month - When eating red meat: choose lean cuts and keep the portion to the size of deck of cards - Eggs: approx. 0 to 4 times a week  - Fish and lean  poultry: at least 2 a week  - Healthy protein sources include, chicken, turkey, lean beef, lamb - Increase intake of seafood such as tuna, salmon, trout, mackerel, shrimp, scallops - Avoid or limit high fat processed meats such as sausage and bacon  Dairy - Include moderate amounts of low fat dairy products  - Focus on healthy dairy such as fat free yogurt, skim milk, low or reduced fat cheese - Limit dairy products higher in fat such as whole or 2% milk, cheese, ice cream  Alcohol - Moderate amounts of red wine is ok  - No more than 5 oz daily for women (all ages) and men older than age 65  - No   more than 10 oz of wine daily for men younger than 65  Other - Limit sweets and other desserts  - Use herbs and spices instead of salt to flavor foods  - Herbs and spices common to the traditional Mediterranean Diet include: basil, bay leaves, chives, cloves, cumin, fennel, garlic, lavender, marjoram, mint, oregano, parsley, pepper, rosemary, sage, savory, sumac, tarragon, thyme   It's not just a diet, it's a lifestyle:  . The Mediterranean diet includes lifestyle factors typical of those in the region  . Foods, drinks and meals are best eaten with others and savored . Daily physical activity is important for overall good health . This could be strenuous exercise like running and aerobics . This could also be more leisurely activities such as walking, housework, yard-work, or taking the stairs . Moderation is the key; a balanced and healthy diet accommodates most foods and drinks . Consider portion sizes and frequency of consumption of certain foods   Meal Ideas & Options:  . Breakfast:  o Whole wheat toast or whole wheat English muffins with peanut butter & hard boiled egg o Steel cut oats topped with apples & cinnamon and skim milk  o Fresh fruit: banana, strawberries, melon, berries, peaches  o Smoothies: strawberries, bananas, greek yogurt, peanut butter o Low fat greek yogurt with  blueberries and granola  o Egg white omelet with spinach and mushrooms o Breakfast couscous: whole wheat couscous, apricots, skim milk, cranberries  . Sandwiches:  o Hummus and grilled vegetables (peppers, zucchini, squash) on whole wheat bread   o Grilled chicken on whole wheat pita with lettuce, tomatoes, cucumbers or tzatziki  o Tuna salad on whole wheat bread: tuna salad made with greek yogurt, olives, red peppers, capers, green onions o Garlic rosemary lamb pita: lamb sauted with garlic, rosemary, salt & pepper; add lettuce, cucumber, greek yogurt to pita - flavor with lemon juice and black pepper  . Seafood:  o Mediterranean grilled salmon, seasoned with garlic, basil, parsley, lemon juice and black pepper o Shrimp, lemon, and spinach whole-grain pasta salad made with low fat greek yogurt  o Seared scallops with lemon orzo  o Seared tuna steaks seasoned salt, pepper, coriander topped with tomato mixture of olives, tomatoes, olive oil, minced garlic, parsley, green onions and cappers  . Meats:  o Herbed greek chicken salad with kalamata olives, cucumber, feta  o Red bell peppers stuffed with spinach, bulgur, lean ground beef (or lentils) & topped with feta   o Kebabs: skewers of chicken, tomatoes, onions, zucchini, squash  o Turkey burgers: made with red onions, mint, dill, lemon juice, feta cheese topped with roasted red peppers . Vegetarian o Cucumber salad: cucumbers, artichoke hearts, celery, red onion, feta cheese, tossed in olive oil & lemon juice  o Hummus and whole grain pita points with a greek salad (lettuce, tomato, feta, olives, cucumbers, red onion) o Lentil soup with celery, carrots made with vegetable broth, garlic, salt and pepper  o Tabouli salad: parsley, bulgur, mint, scallions, cucumbers, tomato, radishes, lemon juice, olive oil, salt and pepper.       Fat and Cholesterol Restricted Eating Plan Eating a diet that limits fat and cholesterol may help lower your risk  for heart disease and other conditions. Your body needs fat and cholesterol for basic functions, but eating too much of these things can be harmful to your health. Your health care provider may order lab tests to check your blood fat (lipid) and cholesterol levels. This helps your health   care provider understand your risk for certain conditions and whether you need to make diet changes. Work with your health care provider or dietitian to make an eating plan that is right for you. Your plan includes:  Limit your fat intake to ______% or less of your total calories a day.  Limit your saturated fat intake to ______% or less of your total calories a day.  Limit the amount of cholesterol in your diet to less than _________mg a day.  Eat ___________ g of fiber a day. What are tips for following this plan? General guidelines   If you are overweight, work with your health care provider to lose weight safely. Losing just 5-10% of your body weight can improve your overall health and help prevent diseases such as diabetes and heart disease.  Avoid: ? Foods with added sugar. ? Fried foods. ? Foods that contain partially hydrogenated oils, including stick margarine, some tub margarines, cookies, crackers, and other baked goods.  Limit alcohol intake to no more than 1 drink a day for nonpregnant women and 2 drinks a day for men. One drink equals 12 oz of beer, 5 oz of wine, or 1 oz of hard liquor. Reading food labels  Check food labels for: ? Trans fats, partially hydrogenated oils, or high amounts of saturated fat. Avoid foods that contain saturated fat and trans fat. ? The amount of cholesterol in each serving. Try to eat no more than 200 mg of cholesterol each day. ? The amount of fiber in each serving. Try to eat at least 20-30 g of fiber each day.  Choose foods with healthy fats, such as: ? Monounsaturated and polyunsaturated fats. These include olive and canola oil, flaxseeds, walnuts,  almonds, and seeds. ? Omega-3 fats. These are found in foods such as salmon, mackerel, sardines, tuna, flaxseed oil, and ground flaxseeds.  Choose grain products that have whole grains. Look for the word "whole" as the first word in the ingredient list. Cooking  Cook foods using methods other than frying. Baking, boiling, grilling, and broiling are some healthy options.  Eat more home-cooked food and less restaurant, buffet, and fast food.  Avoid cooking using saturated fats. ? Animal sources of saturated fats include meats, butter, and cream. ? Plant sources of saturated fats include palm oil, palm kernel oil, and coconut oil. Meal planning   At meals, imagine dividing your plate into fourths: ? Fill one-half of your plate with vegetables and green salads. ? Fill one-fourth of your plate with whole grains. ? Fill one-fourth of your plate with lean protein foods.  Eat fish that is high in omega-3 fats at least two times a week.  Eat more foods that contain fiber, such as whole grains, beans, apples, broccoli, carrots, peas, and barley. These foods help promote healthy cholesterol levels in the blood. Recommended foods Grains  Whole grains, such as whole wheat or whole grain breads, crackers, cereals, and pasta. Unsweetened oatmeal, bulgur, barley, quinoa, or brown rice. Corn or whole wheat flour tortillas. Vegetables  Fresh or frozen vegetables (raw, steamed, roasted, or grilled). Green salads. Fruits  All fresh, canned (in natural juice), or frozen fruits. Meats and other protein foods  Ground beef (85% or leaner), grass-fed beef, or beef trimmed of fat. Skinless chicken or turkey. Ground chicken or turkey. Pork trimmed of fat. All fish and seafood. Egg whites. Dried beans, peas, or lentils. Unsalted nuts or seeds. Unsalted canned beans. Natural nut butters without added sugar and oil. Dairy    Low-fat or nonfat dairy products, such as skim or 1% milk, 2% or reduced-fat cheeses,  low-fat and fat-free ricotta or cottage cheese, or plain low-fat and nonfat yogurt. Fats and oils  Tub margarine without trans fats. Light or reduced-fat mayonnaise and salad dressings. Avocado. Olive, canola, sesame, or safflower oils. The items listed above may not be a complete list of recommended foods or beverages. Contact your dietitian for more options. Foods to avoid Grains  White bread. White pasta. White rice. Cornbread. Bagels, pastries, and croissants. Crackers and snack foods that contain trans fat and hydrogenated oils. Vegetables  Vegetables cooked in cheese, cream, or butter sauce. Fried vegetables. Fruits  Canned fruit in heavy syrup. Fruit in cream or butter sauce. Fried fruit. Meats and other protein foods  Fatty cuts of meat. Ribs, chicken wings, bacon, sausage, bologna, salami, chitterlings, fatback, hot dogs, bratwurst, and packaged lunch meats. Liver and organ meats. Whole eggs and egg yolks. Chicken and turkey with skin. Fried meat. Dairy  Whole or 2% milk, cream, half-and-half, and cream cheese. Whole milk cheeses. Whole-fat or sweetened yogurt. Full-fat cheeses. Nondairy creamers and whipped toppings. Processed cheese, cheese spreads, and cheese curds. Beverages  Alcohol. Sugar-sweetened drinks such as sodas, lemonade, and fruit drinks. Fats and oils  Butter, stick margarine, lard, shortening, ghee, or bacon fat. Coconut, palm kernel, and palm oils. Sweets and desserts  Corn syrup, sugars, honey, and molasses. Candy. Jam and jelly. Syrup. Sweetened cereals. Cookies, pies, cakes, donuts, muffins, and ice cream. The items listed above may not be a complete list of foods and beverages to avoid. Contact your dietitian for more information. Summary  Your body needs fat and cholesterol for basic functions. However, eating too much of these things can be harmful to your health.  Work with your health care provider and dietitian to follow a diet low in fat and  cholesterol. Doing this may help lower your risk for heart disease and other conditions.  Choose healthy fats, such as monounsaturated and polyunsaturated fats, and foods high in omega-3 fatty acids.  Eat fiber-rich foods, such as whole grains, beans, peas, fruits, and vegetables.  Limit or avoid alcohol, fried foods, and foods high in saturated fats, partially hydrogenated oils, and sugar. This information is not intended to replace advice given to you by your health care provider. Make sure you discuss any questions you have with your health care provider. Document Revised: 01/14/2017 Document Reviewed: 10/19/2016 Elsevier Patient Education  2020 [employer] Inc.  American Heart Association (AHA) Exercise Recommendation  Being physically active is important to prevent heart disease and stroke, the nation's No. 1and No. 5killers. To improve overall cardiovascular health, we suggest at least 150 minutes per week of moderate exercise or 75 minutes per week of vigorous exercise (or a combination of moderate and vigorous activity). Thirty minutes a day, five times a week is an easy goal to remember. You will also experience benefits even if you divide your time into two or three segments of 10 to 15 minutes per day.  For people who would benefit from lowering their blood pressure or cholesterol, we recommend 40 minutes of aerobic exercise of moderate to vigorous intensity three to four times a week to lower the risk for heart attack and stroke.  Physical activity is anything that makes you move your body and burn calories.  This includes things like climbing stairs or playing sports. Aerobic exercises benefit your heart, and include walking, jogging, swimming or biking. Strength and stretching exercises are   best for overall stamina and flexibility.  The simplest, positive change you can make to effectively improve your heart health is to start walking. It's enjoyable, free, easy, social and great  exercise. A walking program is flexible and boasts high success rates because people can stick with it. It's easy for walking to become a regular and satisfying part of life.   For Overall Cardiovascular Health:  At least 30 minutes of moderate-intensity aerobic activity at least 5 days per week for a total of 150  OR   At least 25 minutes of vigorous aerobic activity at least 3 days per week for a total of 75 minutes; or a combination of moderate- and vigorous-intensity aerobic activity  AND   Moderate- to high-intensity muscle-strengthening activity at least 2 days per week for additional health benefits.  For Lowering Blood Pressure and Cholesterol  An average 40 minutes of moderate- to vigorous-intensity aerobic activity 3 or 4 times per week  What if I can't make it to the time goal? Something is always better than nothing! And everyone has to start somewhere. Even if you've been sedentary for years, today is the day you can begin to make healthy changes in your life. If you don't think you'll make it for 30 or 40 minutes, set a reachable goal for today. You can work up toward your overall goal by increasing your time as you get stronger. Don't let all-or-nothing thinking rob you of doing what you can every day.  Source:http://www.heart.org    

## 2020-01-23 LAB — COMPREHENSIVE METABOLIC PANEL
AST: 17 IU/L (ref 0–40)
Albumin/Globulin Ratio: 1.7 (ref 1.2–2.2)
Albumin: 4.3 g/dL (ref 3.8–4.8)
BUN/Creatinine Ratio: 16 (ref 9–23)
BUN: 12 mg/dL (ref 6–24)
Bilirubin Total: 0.6 mg/dL (ref 0.0–1.2)
CO2: 23 mmol/L (ref 20–29)
Calcium: 9.7 mg/dL (ref 8.7–10.2)
Chloride: 101 mmol/L (ref 96–106)
Creatinine, Ser: 0.73 mg/dL (ref 0.57–1.00)
GFR calc Af Amer: 118 mL/min/{1.73_m2} (ref >59)
GFR calc non Af Amer: 103 mL/min/{1.73_m2} (ref >59)
Globulin, Total: 2.6 g/dL (ref 1.5–4.5)
Glucose: 96 mg/dL (ref 65–99)
Potassium: 4.5 mmol/L (ref 3.5–5.2)
Sodium: 137 mmol/L (ref 134–144)
Total Protein: 6.9 g/dL (ref 6.0–8.5)

## 2020-01-23 LAB — CBC WITH DIFFERENTIAL
Basophils Absolute: 0.1 10*3/uL (ref 0.0–0.2)
Basos: 1 %
Hematocrit: 42.2 % (ref 34.0–46.6)
Hemoglobin: 14.4 g/dL (ref 11.1–15.9)
Immature Grans (Abs): 0 10*3/uL (ref 0.0–0.1)
Lymphocytes Absolute: 1.7 10*3/uL (ref 0.7–3.1)
Lymphs: 30 %
MCH: 31.4 pg (ref 26.6–33.0)
MCV: 92 fL (ref 79–97)
Monocytes: 9 %
Neutrophils Absolute: 3.3 10*3/uL (ref 1.4–7.0)
Neutrophils: 57 %
RBC: 4.59 x10E6/uL (ref 3.77–5.28)
WBC: 5.8 10*3/uL (ref 3.4–10.8)

## 2020-01-23 LAB — LIPID PANEL
Chol/HDL Ratio: 3.2 ratio (ref 0.0–4.4)
Cholesterol, Total: 232 mg/dL — ABNORMAL HIGH (ref 100–199)
HDL: 72 mg/dL (ref >39)
LDL Chol Calc (NIH): 147 mg/dL — ABNORMAL HIGH (ref 0–99)
Triglycerides: 75 mg/dL (ref 0–149)
VLDL Cholesterol Cal: 13 mg/dL (ref 5–40)

## 2020-01-23 LAB — HEMOGLOBIN A1C: Est. average glucose Bld gHb Est-mCnc: 94 mg/dL

## 2020-01-23 LAB — TSH: TSH: 3.58 u[IU]/mL (ref 0.450–4.500)

## 2020-01-23 NOTE — Progress Notes (Signed)
Hello,  If we could send a letter -  Cholesterol is mildly elevated, but otherwise labs look great! Keep an eye on diet but no major changes warranted. Lets check in again in 2022! Happy holidays,  Rich

## 2021-09-20 IMAGING — MG MM DIGITAL DIAGNOSTIC UNILAT*R* W/ TOMO W/ CAD
4 series · 4 of 12 positions shown · non-contrast
Comparison: Previous exams including recent screening mammogram
dated 02/06/2019

CLINICAL DATA: Patient returns today to evaluate a possible breast
asymmetry questioned on recent screening

EXAM:
DIGITAL DIAGNOSTIC RIGHT MAMMOGRAM WITH CAD AND TOMO
ULTRASOUND RIGHT BREAST

[R ML synth-2D]
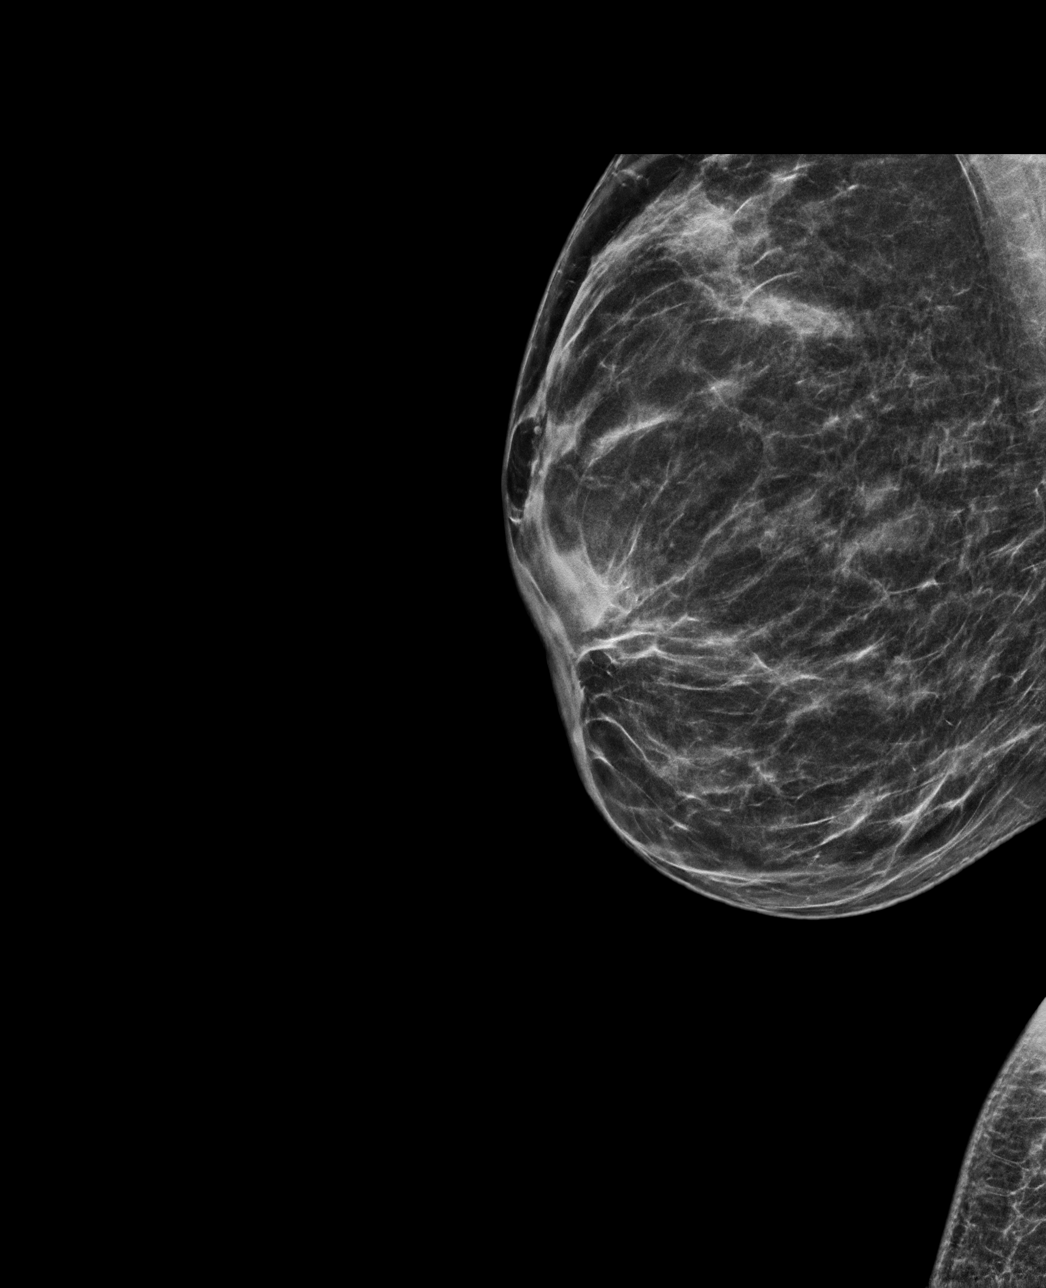

[R CC synth-2D]
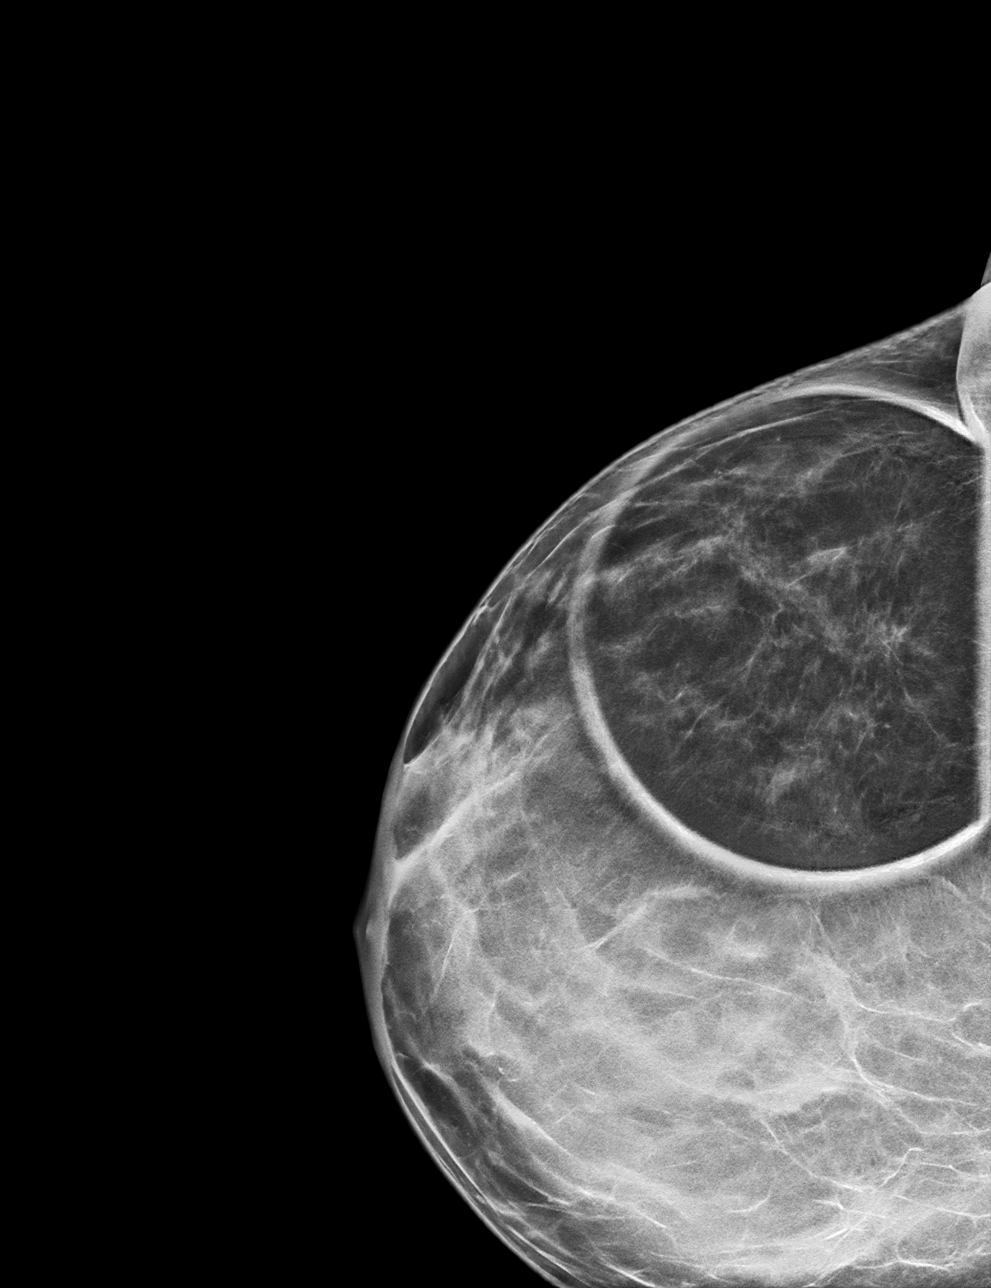

[R CC tomo · tomo slice 31/61.0]
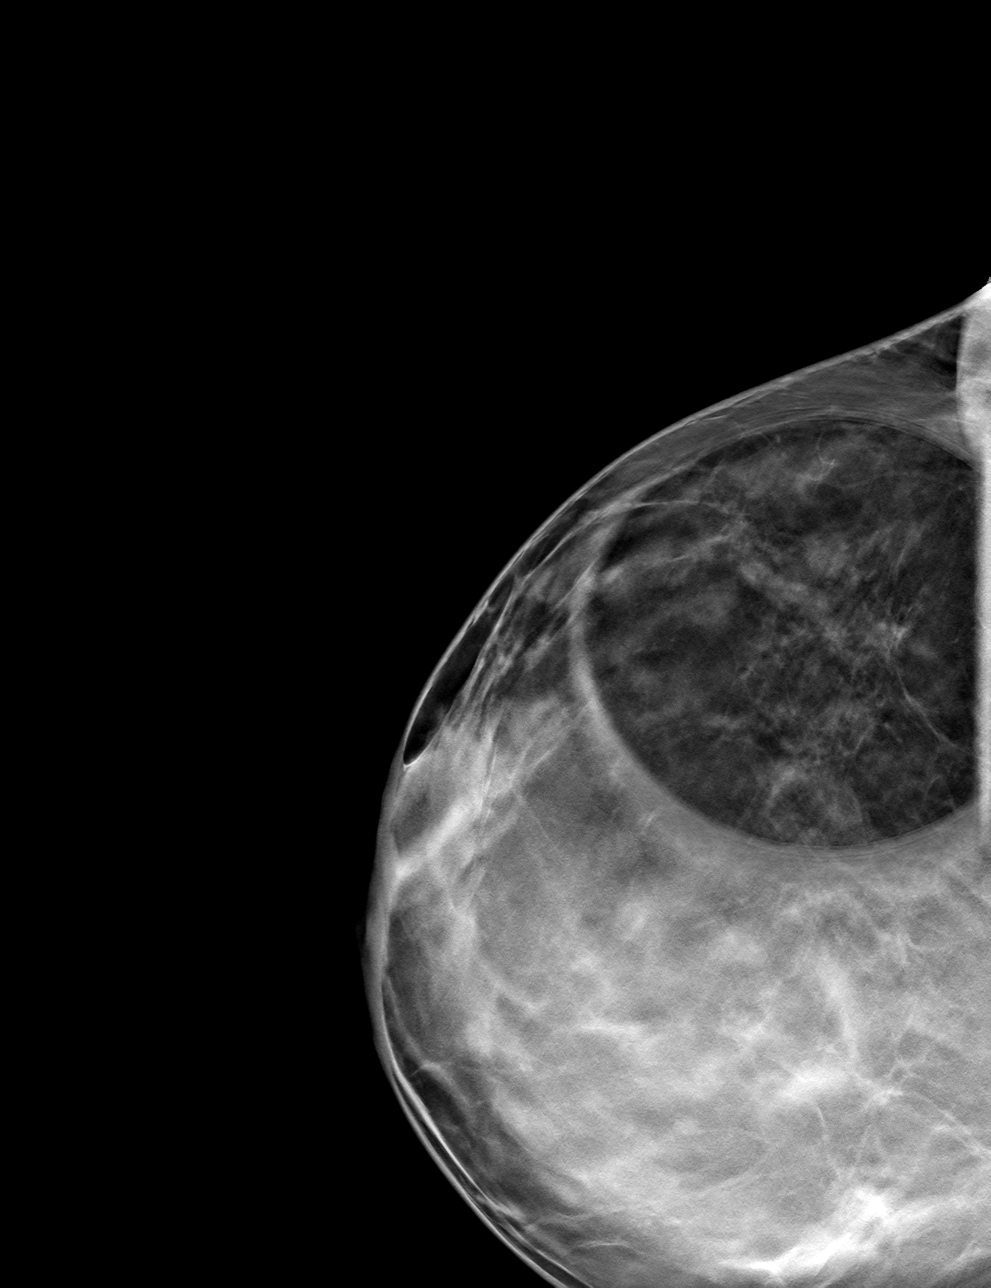

[R ML tomo · tomo slice 32/63.0]
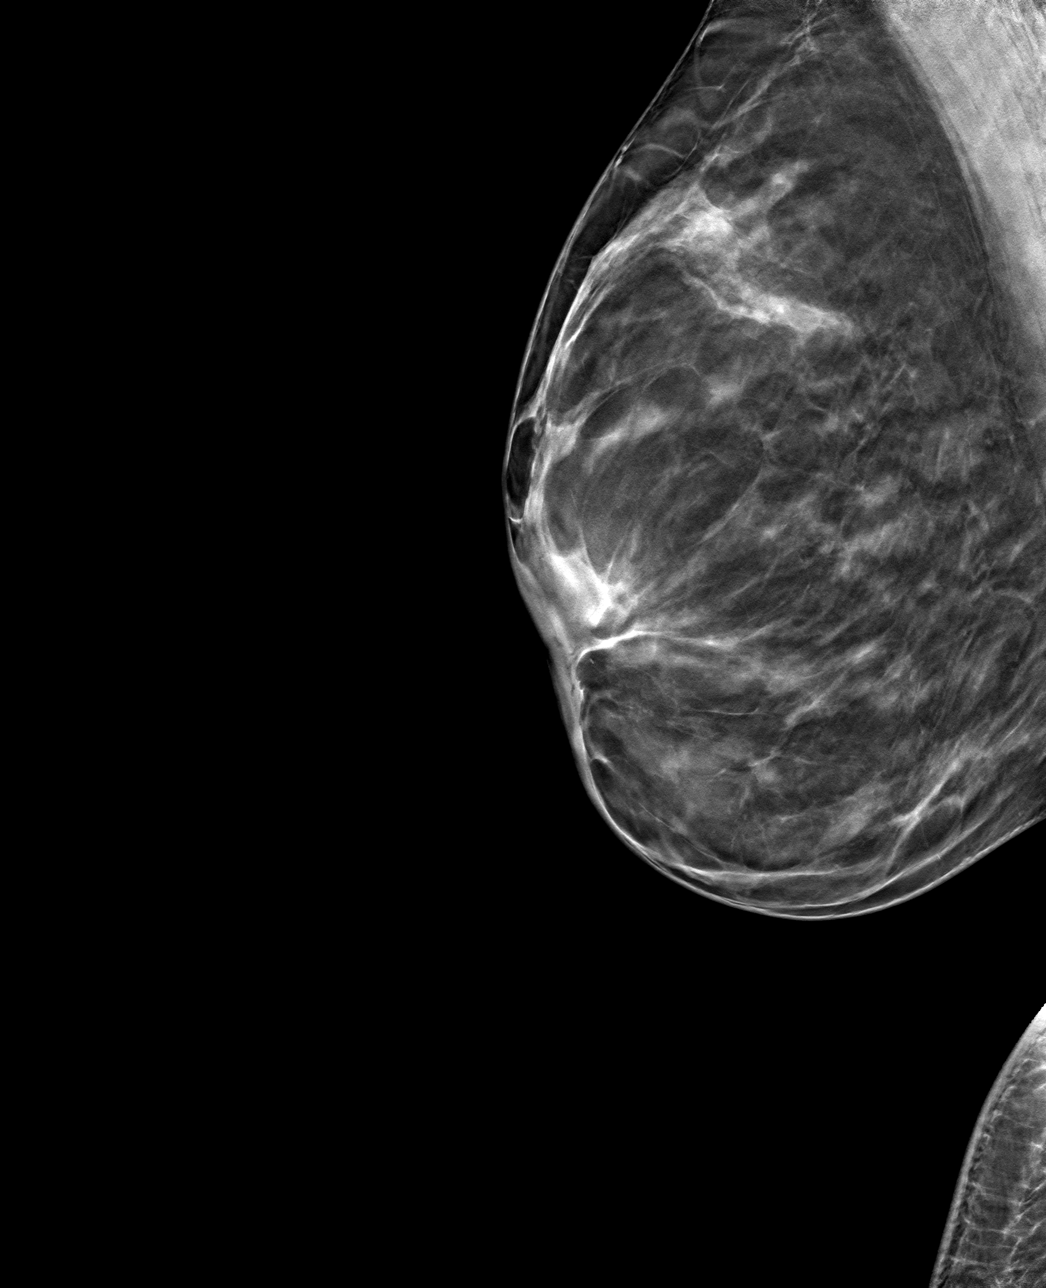

[4 of 12 positions shown; findings below may reference images not displayed]

ACR Breast Density Category b: There are scattered areas of
fibroglandular density.
FINDINGS: There is a partially obscured mass confirmed within the outer RIGHT
breast, lower outer quadrant based on tomosynthesis slice position,
measuring approximately 1 cm greatest dimension.

Mammographic images were processed with CAD.

Targeted ultrasound is performed, showing an irregular hypoechoic
mass in the RIGHT breast at the 9:30 o'clock axis, 4 cm from the
nipple, measuring 1.2 x 0.7 x 0.9 cm, without internal vascularity,
with lobular and angulated margins, a likely correlate for the
mammographic finding.

Adjacent benign cluster of cysts at the 9:30 o'clock axis, 4 cm from
the nipple, measures 1 x 0.4 x 0.6 cm, corresponding as an
incidental finding.

There is an additional hypoechoic mass in the RIGHT breast at the
6:30 o'clock axis, 4 cm from the nipple, measuring 1 x 0.3 x 0.6 cm,
with internal vascularity, also corresponding as an incidental
finding.

RIGHT axilla was evaluated with ultrasound showing no enlarged or
morphologically abnormal lymph nodes.
IMPRESSION: 1. Irregular hypoechoic mass in the RIGHT breast at the 9:30 o'clock
axis, 4 cm from the nipple, measuring 1.2 x 0.7 x 0.9 cm, a likely
correlate for the mammographic finding. This may represent a cluster
of cysts. Ultrasound-guided biopsy is recommended to ensure
benignity.
2. Additional hypoechoic mass in the RIGHT breast at the 6:30
o'clock axis, 4 cm from the nipple, measuring 1 x 0.3 x 0.6 cm, with
internal vascularity, corresponding as an incidental finding. This
may represent an atypical fibroadenoma. This is a suspicious finding
for which ultrasound-guided core biopsy is recommended.

RECOMMENDATION:
1. Ultrasound-guided core biopsy of the irregular hypoechoic mass in
the RIGHT breast at the 9:30 o'clock axis, measuring 1.2 x 0.7 x
cm, a likely correlate for the mammographic finding. Recommend
correlation on postprocedure mammogram.
2. Ultrasound-guided biopsy of the additional hypoechoic mass in the
RIGHT breast at the 6:30 o'clock axis, measuring 1 x 0.3 x 0.6 cm,
with internal vascularity, corresponding as an incidental finding.

Ultrasound-guided core biopsies are scheduled for [REDACTED].

I have discussed the findings and recommendations with the patient.
If applicable, a reminder letter will be sent to the patient
regarding the next appointment.

BI-RADS CATEGORY  4: Suspicious.
